# Patient Record
Sex: Male | Born: 1955 | Race: White | Hispanic: No | Marital: Married | Smoking: Light tobacco smoker
Health system: Southern US, Community
[De-identification: ages and names within clinical notes are randomized; demographics above are authoritative.]

## PROBLEM LIST (undated history)

## (undated) DIAGNOSIS — R002 Palpitations: Secondary | ICD-10-CM

## (undated) DIAGNOSIS — I493 Ventricular premature depolarization: Secondary | ICD-10-CM

## (undated) HISTORY — DX: Palpitations: R00.2

## (undated) HISTORY — DX: Ventricular premature depolarization: I49.3

## (undated) HISTORY — PX: OTHER SURGICAL HISTORY: SHX169

---

## 2015-01-25 ENCOUNTER — Telehealth: Payer: Self-pay | Admitting: Cardiovascular Disease

## 2015-01-25 NOTE — Telephone Encounter (Signed)
I will forward this message to Dr Cooper.  

## 2015-01-25 NOTE — Telephone Encounter (Signed)
Dr Excell Seltzer spoke with Dr Roanna Banning and this pt was hospitalized in [place].  Per Dr Excell Seltzer the pt needs a New Pt appt.  The pt can be scheduled on 02/04/15 at 3:30 or 02/09/15 at 4:15 with Dr Excell Seltzer. I left the pt a message to contact our office to arrange appointment.  The pt will need to bring his hospital records to appointment or have them faxed to the office for Dr Excell Seltzer to review.

## 2015-01-25 NOTE — Telephone Encounter (Signed)
Dr Loleta Rose is requesting a call from Dr. Donella Stade he is out until 01-28-15

## 2015-01-27 NOTE — Telephone Encounter (Signed)
Pt scheduled to see Dr Excell Seltzer on 02/09/15.

## 2015-02-09 ENCOUNTER — Encounter: Payer: Self-pay | Admitting: Cardiovascular Disease

## 2015-02-09 ENCOUNTER — Ambulatory Visit (INDEPENDENT_AMBULATORY_CARE_PROVIDER_SITE_OTHER): Payer: Managed Care, Other (non HMO) | Admitting: Cardiovascular Disease

## 2015-02-09 VITALS — BP 112/70 | HR 66 | Ht 71.0 in | Wt 204.8 lb

## 2015-02-09 DIAGNOSIS — I493 Ventricular premature depolarization: Secondary | ICD-10-CM

## 2015-02-09 DIAGNOSIS — I429 Cardiomyopathy, unspecified: Secondary | ICD-10-CM

## 2015-02-09 MED ORDER — METOPROLOL SUCCINATE ER 25 MG PO TB24: ORAL_TABLET | ORAL | Status: DC

## 2015-02-09 NOTE — Patient Instructions (Addendum)
Medication Instructions: - Increase toprol (metoprolol succinate) to 25 mg one whole tablet at bedtime  Labwork: - Your physician recommends that you return for FASTING lab work next week/ the day your holter monitor is placed: lipid/ liver  Procedures/Testing: - Your physician has recommended that you wear a 24 hour holter monitor- next week. Holter monitors are medical devices that record the heart's electrical activity. Doctors most often use these monitors to diagnose arrhythmias. Arrhythmias are problems with the speed or rhythm of the heartbeat. The monitor is a small, portable device. You can wear one while you do your normal daily activities. This is usually used to diagnose what is causing palpitations/syncope (passing out).  Your physician has requested that you have a cardiac MRI- in 6 months. Cardiac MRI uses a computer to create images of your heart as its beating, producing both still and moving pictures of your heart and major blood vessels. For further information please visit [email or web address]. Please follow the instruction sheet given to you today for more information.  Follow-Up: Your physician wants you to follow-up in: 6 months with Dr. Excell Seltzer (just after your cardiac MRI) You will receive a reminder letter in the mail two months in advance. If you don't receive a letter, please call our office to schedule the follow-up appointment.  Any Additional Special Instructions Will Be Listed Below (If Applicable).

## 2015-02-09 NOTE — Progress Notes (Signed)
Cardiology Office Note   Date:  02/09/2015   ID:  Derrick Wood, DOB 1955/10/10, MRN [id number]  PCP:  No primary care provider on file.  Cardiologist:  Tonny Bollman, MD    Chief Complaint  Patient presents with  . Palpitations    History of Present Illness: Derrick Wood is a 59 y.o. male who presents for evaluation of PVC's and cardiomyopathy. He has been healthy and really has not had any past medical problems.   He was recently traveling in [place], [place] and complained of palpitations and associated nausea and weakness. This occurred in the context of increased alcohol intake, energy drinks, and mild dehydration. He has not had chest pain or pressure, dyspnea, lightheadedness, or syncope. He exercises vigorously and has no exertional symptoms. When his symptoms occurred, he was hospitalized for observation and underwent an echo and stress test. He was noted to have the following findings:  Mild LV dysfunction by echo with an LVEF of 45%  Bicuspid aortic valve with normal function  Mildly dilated aortic root  Good exercise tolerance with diaphragmatic attenuation but otherwise normal perfusion on stress testing   Past Medical History  Diagnosis Date  . Palpitations   . PVC (premature ventricular contraction)     Past Surgical History  Procedure Laterality Date  . Rotater cuff repair      LEFT SHOULDER    Current Outpatient Prescriptions  Medication Sig Dispense Refill  . famotidine (PEPCID) 20 MG tablet Take 20 mg by mouth 2 (two) times daily.    . metoprolol succinate (TOPROL-XL) 25 MG 24 hr tablet Take 25 mg by mouth daily.    Marland Kitchen zolpidem (AMBIEN) 5 MG tablet Take 5 mg by mouth at bedtime as needed for sleep (As need for sleep).     No current facility-administered medications for this visit.    Allergies:   Codeine   Social History:  The patient  reports that he has been smoking.  He started smoking about 10 years ago. He does not have any smokeless  tobacco history on file. He reports that he drinks alcohol. He reports that he does not use illicit drugs.   Family History:  The patient's family history includes Heart attack in his father; Heart failure in his father; Hypertension in his brother.   Father massive MI at 30, died of CHF at 46 Heart disease in paternal uncles Mother without heart problems   ROS:  Please see the history of present illness.  Otherwise, review of systems is positive for palpitations.  All other systems are reviewed and negative.    PHYSICAL EXAM: VS:  BP 112/70 mmHg  Pulse 66  Ht  (1.803 m)  Wt 204 lb 12.8 oz (92.897 kg)  BMI 28.58 kg/m2 , BMI Body mass index is 28.58 kg/(m^2). GEN: Well nourished, well developed, in no acute distress HEENT: normal Neck: no JVD, no masses. No carotid bruits Cardiac: RRR without murmur or gallop                Respiratory:  clear to auscultation bilaterally, normal work of breathing GI: soft, nontender, nondistended, + BS MS: no deformity or atrophy Ext: no pretibial edema, pedal pulses 2+= bilaterally Skin: warm and dry, no rash Neuro:  Strength and sensation are intact Psych: euthymic mood, full affect  EKG:  EKG is ordered today. The ekg ordered today shows NSR 66 bpm, within normal limits  Recent Labs: No results found for requested labs within last 365 days.  Lipid Panel  No results found for: CHOL, TRIG, HDL, CHOLHDL, VLDL, LDLCALC, LDLDIRECT    Wt Readings from Last 3 Encounters:  02/09/15 204 lb 12.8 oz (92.897 kg)     Cardiac Studies Reviewed: Echo, nuclear study reviewed via Care Everywhere  2D Echo: Findings   Study Quality:  The study quality is technically difficult.    Left Ventricle:  The left ventricular chamber size is normal. The estimated ejection  fraction is 40-45%. Abnormal left ventricular diastolic filling is  observed, consistent with impaired relaxation.    Left Atrium:  The left atrial chamber  size is normal.     Right Ventricle:  The right ventricle is not well visualized.     Right Atrium:  The right atrium is not well visualized.     Aortic Valve:  The aortic valve appears bicuspid. The aortic valve leaflets are mildly  thickened. There is no evidence of aortic stenosis. There is no evidence  of aortic regurgitation.     Mitral Valve:  The mitral valve leaflets appear normal. There is a trace of mitral  regurgitation.     Pericardium:  There is no pericardial effusion.     Aorta:  There is mild dilatation of the aortic root.     Conclusions  The study quality is technically difficult.   The left ventricular chamber size is normal.  The estimated ejection fraction is 40-45%.  Abnormal left ventricular diastolic filling is observed, consistent with  impaired relaxation.   There is mild dilatation of the aortic root.  The aortic valve appears bicuspid  Stress Myoview: Clinical Findings:   Exercise METS: 10.1   Baseline: HR=82 BPMBP=109/83 mm HG   Max: HR=137 BPM;Target HR was 137 BP= 193/65 mm Hg   Arrhythmia: Frequent PVCs which decreased in the last stage, one couplet   Symptoms: None   Resting EKG: Normal sinus rhythm with PVCs   Stress EKG: No ST depression   Test Termination: Target heart rate reached, hypertension    Stress Summary:   1. Adequate treadmill exercise test  2. Hypertensive response to exercise  3. Good exercise tolerance  4. Negative for ischemia  5. Negative for angina    Nuclear Perfusion Findings:   Raw data chest, good quality study prone to attenuation artifact.  Static image review: Inferior apical and basilar inferior hypoperfusion. No wall motion abnormality. The inferior defect is most likely attenuation related. Ejection fraction 49%.    ASSESSMENT AND PLAN: 1.  Palpitations/PVC's: suspect related to  dehydration/alcohol/energy drinks. Counseled regarding importance of limiting Etoh to 2 drinks or less, staying well-hydrated, and avoiding energy drinks or excessive caffeine. Issue of mild cardiomyopathy brings up question of whether he has more significant chronic PVC burden. Recommend a 24 Holter to quantify.  2. Cardiomyopathy: NYHA Functional Class 1 symptoms with the ability to exercise for > 1 hour without symptoms. Recommended increase Toprol XL to 25 mg daily. Will check Cardiac MRI after 6 months of beta-blockade to reassess. Doubt BP will allow for additional Rx and would only pursue ACE/ARB if LVEF remains down after 6 month reassessment.   3. Bicuspid Ao Valve: normal function by echo and physical exam. Cardiac MRI in 6 months to assess for significant aortic dilatation.   Current medicines are reviewed with the patient today.  The patient does not have concerns regarding medicines.  Labs/ tests ordered today include:   Orders Placed This Encounter  Procedures  . MR Card Morphology Wo/W Cm  .  Lipid panel  . Hepatic function panel  . Holter monitor - 24 hour  . EKG 12-Lead   Disposition:   FU 6 months after Cardiac MRI  Signed, Tonny Bollman, MD  02/09/2015 5:34 PM    [facility] [address] [place], [place], [place]  [address] Phone: [phone]; Fax: [phone]

## 2015-02-10 ENCOUNTER — Ambulatory Visit: Payer: Self-pay | Admitting: Cardiology

## 2015-02-15 ENCOUNTER — Encounter: Payer: Self-pay | Admitting: Cardiovascular Disease

## 2015-02-18 ENCOUNTER — Other Ambulatory Visit: Payer: Managed Care, Other (non HMO)

## 2015-02-24 ENCOUNTER — Other Ambulatory Visit (INDEPENDENT_AMBULATORY_CARE_PROVIDER_SITE_OTHER): Payer: Managed Care, Other (non HMO) | Admitting: *Deleted

## 2015-02-24 ENCOUNTER — Ambulatory Visit (INDEPENDENT_AMBULATORY_CARE_PROVIDER_SITE_OTHER): Payer: Managed Care, Other (non HMO)

## 2015-02-24 DIAGNOSIS — I493 Ventricular premature depolarization: Secondary | ICD-10-CM | POA: Diagnosis not present

## 2015-02-24 DIAGNOSIS — I429 Cardiomyopathy, unspecified: Secondary | ICD-10-CM

## 2015-02-24 LAB — LIPID PANEL
Cholesterol: 213 mg/dL — ABNORMAL HIGH (ref 0–200)
HDL: 56.9 mg/dL (ref >39.00)
LDL Cholesterol: 129 mg/dL — ABNORMAL HIGH (ref 0–99)
NonHDL: 156.1
TRIGLYCERIDES: 137 mg/dL (ref 0.0–149.0)
Total CHOL/HDL Ratio: 4
VLDL: 27.4 mg/dL (ref 0.0–40.0)

## 2015-02-24 LAB — HEPATIC FUNCTION PANEL
ALBUMIN: 4 g/dL (ref 3.5–5.2)
ALT: 20 U/L (ref 0–53)
AST: 22 U/L (ref 0–37)
Alkaline Phosphatase: 48 U/L (ref 39–117)
Bilirubin, Direct: 0.2 mg/dL (ref 0.0–0.3)
TOTAL PROTEIN: 6.8 g/dL (ref 6.0–8.3)
Total Bilirubin: 0.8 mg/dL (ref 0.2–1.2)

## 2015-07-13 ENCOUNTER — Ambulatory Visit (HOSPITAL_COMMUNITY)
Admission: RE | Admit: 2015-07-13 | Discharge: 2015-07-13 | Disposition: A | Discharge status: Home / Self Care | Payer: Managed Care, Other (non HMO) | Source: Ambulatory Visit | Attending: Cardiovascular Disease | Admitting: Cardiovascular Disease

## 2015-07-13 DIAGNOSIS — I429 Cardiomyopathy, unspecified: Secondary | ICD-10-CM | POA: Diagnosis not present

## 2015-07-13 DIAGNOSIS — I493 Ventricular premature depolarization: Secondary | ICD-10-CM | POA: Insufficient documentation

## 2015-07-13 DIAGNOSIS — Q231 Congenital insufficiency of aortic valve: Secondary | ICD-10-CM | POA: Diagnosis not present

## 2015-07-13 DIAGNOSIS — I77810 Thoracic aortic ectasia: Secondary | ICD-10-CM | POA: Diagnosis not present

## 2015-07-13 LAB — CREATININE, SERUM
Creatinine, Ser: 1.16 mg/dL (ref 0.61–1.24)
GFR calc non Af Amer: >60 mL/min (ref >60)

## 2015-07-13 MED ORDER — GADOBENATE DIMEGLUMINE 529 MG/ML IV SOLN
30.0000 mL | Freq: Once | INTRAVENOUS | Status: AC
  Administered 2015-07-13: 29 mL via INTRAVENOUS

## 2015-07-28 ENCOUNTER — Telehealth: Payer: Self-pay | Admitting: Cardiovascular Disease

## 2015-07-28 MED ORDER — RAMIPRIL 2.5 MG PO CAPS: 2.5000 mg | ORAL_CAPSULE | Freq: Every day | ORAL | Status: DC

## 2015-07-28 NOTE — Telephone Encounter (Signed)
Notes Recorded by Tonny Bollman, MD on 07/21/2015 at 6:37 AM MRI reviewed. Mild LV/RV dysfunction. Would start ramipril 2.5 mg daily to add to his beta-blocker. Should have echo (LV dysfunction) and MRA chest (Aortic root dilatation with bicuspid Ao valve) in ONE YEAR   Pt aware of results by phone. Rx sent to the pharmacy.  Pt scheduled for routine follow-up with Dr Excell Seltzer in December.

## 2015-07-28 NOTE — Telephone Encounter (Signed)
Follow Up  ° °Pt returned call for results °

## 2015-08-16 DIAGNOSIS — R002 Palpitations: Secondary | ICD-10-CM | POA: Insufficient documentation

## 2015-08-16 DIAGNOSIS — I493 Ventricular premature depolarization: Secondary | ICD-10-CM | POA: Insufficient documentation

## 2015-08-18 ENCOUNTER — Ambulatory Visit (INDEPENDENT_AMBULATORY_CARE_PROVIDER_SITE_OTHER): Payer: Managed Care, Other (non HMO) | Admitting: Cardiovascular Disease

## 2015-08-18 VITALS — BP 108/80 | HR 67 | Ht 71.0 in | Wt 207.5 lb

## 2015-08-18 DIAGNOSIS — I428 Other cardiomyopathies: Secondary | ICD-10-CM

## 2015-08-18 DIAGNOSIS — I77810 Thoracic aortic ectasia: Secondary | ICD-10-CM

## 2015-08-18 DIAGNOSIS — I429 Cardiomyopathy, unspecified: Secondary | ICD-10-CM

## 2015-08-18 DIAGNOSIS — Q231 Congenital insufficiency of aortic valve: Secondary | ICD-10-CM

## 2015-08-18 MED ORDER — RAMIPRIL 5 MG PO CAPS: 5.0000 mg | ORAL_CAPSULE | Freq: Every day | ORAL | Status: DC

## 2015-08-18 NOTE — Patient Instructions (Addendum)
Medication Instructions:  Your physician has recommended you make the following change in your medication:  1. INCREASE Ramipril to 5mg  take one tablet by mouth daily  Labwork: Your physician recommends that you return for a FASTING LIPID and CMP in 1 YEAR--nothing to eat or drink after midnight, lab opens at 7:30 AM  Testing/Procedures: Your physician has requested that you have an echocardiogram in 1 YEAR.  Echocardiography is a painless test that uses sound waves to create images of your heart. It provides your doctor with information about the size and shape of your heart and how well your heart's chambers and valves are working. This procedure takes approximately one hour. There are no restrictions for this procedure.  Your physician has requested that you have a cardiac MRI in 1 YEAR. Cardiac MRI uses a computer to create images of your heart as its beating, producing both still and moving pictures of your heart and major blood vessels. For further information please visit [email or web address]. Please follow the instruction sheet given to you today for more information.  Follow-Up: Your physician wants you to follow-up in: 1 YEAR with Dr Excell Seltzer.  You will receive a reminder letter in the mail two months in advance. If you don't receive a letter, please call our office to schedule the follow-up appointment.   Any Other Special Instructions Will Be Listed Below (If Applicable).     If you need a refill on your cardiac medications before your next appointment, please call your pharmacy.

## 2015-08-19 ENCOUNTER — Encounter: Payer: Self-pay | Admitting: Cardiovascular Disease

## 2015-08-19 DIAGNOSIS — I428 Other cardiomyopathies: Secondary | ICD-10-CM | POA: Insufficient documentation

## 2015-08-19 NOTE — Progress Notes (Signed)
Cardiology Office Note Date:  08/19/2015   ID:  Derrick Wood, DOB [birth year], MRN [id number]  PCP:  Sherren Mocha, MD  Cardiologist:  Sherren Mocha, MD    Chief Complaint  Patient presents with  . Cardiomyopathy     History of Present Illness: Derrick Wood is a 59 y.o. male who presents for follow-up of PVC's and cardiomyopathy. He was initially seen in June 2016 after presenting at a hospital in [place] with palpitations and weakness. Echo and nuclear stress testing was performed and showed:  Mild LV dysfunction by echo with an LVEF of 45%  Bicuspid aortic valve with normal function  Mildly dilated aortic root  Good exercise tolerance with diaphragmatic attenuation but otherwise normal perfusion on stress testing  The patient feels well. He just returned from a trip to [place]. Today, he denies symptoms of palpitations, chest pain, shortness of breath, orthopnea, PND, lower extremity edema, dizziness, or syncope. He continues to exercise regularly.     Past Medical History  Diagnosis Date  . Palpitations   . PVC (premature ventricular contraction)     Past Surgical History  Procedure Laterality Date  . Rotater cuff repair      LEFT SHOULDER    Current Outpatient Prescriptions  Medication Sig Dispense Refill  . aspirin 81 MG tablet Take 81 mg by mouth daily.    . metoprolol succinate (TOPROL-XL) 25 MG 24 hr tablet Take one tablet (25 mg) by mouth once daily at bedtime 30 tablet 11  . omeprazole (PRILOSEC) 20 MG capsule Take 20 mg by mouth daily.    . ramipril (ALTACE) 5 MG capsule Take 1 capsule (5 mg total) by mouth daily. 90 capsule 3  . zolpidem (AMBIEN) 5 MG tablet Take 5 mg by mouth at bedtime as needed for sleep (As need for sleep).     No current facility-administered medications for this visit.    Allergies:   Codeine   Social History:  The patient  reports that he has been smoking.  He started smoking about 10 years ago. He does not have any  smokeless tobacco history on file. He reports that he drinks alcohol. He reports that he does not use illicit drugs.   Family History:  The patient's  family history includes Heart attack in his father; Heart failure in his father; Hypertension in his brother.    ROS:  Please see the history of present illness.  All other systems are reviewed and negative.    PHYSICAL EXAM: VS:  BP 108/80 mmHg  Pulse 67  Ht '5\' 11"'  (1.803 m)  Wt 207 lb 8 oz (94.121 kg)  BMI 28.95 kg/m2 , BMI Body mass index is 28.95 kg/(m^2). GEN: Well nourished, well developed, in no acute distress HEENT: normal Neck: no JVD, no masses. No carotid bruits Cardiac: RRR without murmur or gallop                Respiratory:  clear to auscultation bilaterally, normal work of breathing GI: soft, nontender, nondistended, + BS MS: no deformity or atrophy Ext: no pretibial edema, pedal pulses 2+= bilaterally Skin: warm and dry, no rash Neuro:  Strength and sensation are intact Psych: euthymic mood, full affect  EKG:  EKG is ordered today. The ekg ordered today shows NSR 67 bpm, within normal limits  Recent Labs: 02/24/2015: ALT 20 07/13/2015: Creatinine, Ser 1.16   Lipid Panel     Component Value Date/Time   CHOL 213* 02/24/2015 0809   TRIG 137.0 02/24/2015  0809   HDL 56.90 02/24/2015 0809   CHOLHDL 4 02/24/2015 0809   VLDL 27.4 02/24/2015 0809   LDLCALC 129* 02/24/2015 0809      Wt Readings from Last 3 Encounters:  08/18/15 207 lb 8 oz (94.121 kg)  02/09/15 204 lb 12.8 oz (92.897 kg)     Cardiac Studies Reviewed: Cardiac MRI: FINDINGS: 1. Normal left ventricular size, thickness and mildly decreased systolic function (LVEF = 47%) with diffuse hypokinesis.  LVEDD: 45 mm  LVESD: 38 mm  LVEDV: 128 ml  LVESV: 68 ml  SV: 60 ml  CO: 3.9 L/minute  2. Normal right ventricular size, thickness and borderline systolic function (RVEF = 44%, normal RVEF > 45%) with no regional wall motion  abnormalities.  RVEDV: 131 ml  RVESV: 73 ml  SV: 58 ml  CO: 3.8 L/minute  3. Normal biatrial size.  4. Bicuspid aortic valve with mild aortic regurgitation. Aortic root is mildly dilated measuring 40 mm, ascending aorta is mildly dilated measuring 42 mm.  5. Mild tricuspid and trivial mitral regurgitation.  6. Minimal focal late gadolinium enhancement at the RV attachment to the left ventricle.  7. Normal size of the pulmonary artery.  IMPRESSION: 1. Normal left ventricular size, thickness and mildly decreased systolic function (LVEF = 47%) with diffuse hypokinesis.  2. Normal right ventricular size, thickness and borderline systolic function (RVEF = 44%, normal RVEF > 45%) with no regional wall motion abnormalities.  3. Minimal focal late gadolinium enhancement at the RV attachment to the left ventricle consistent with elevated filling pressures.  4. Bicuspid aortic valve with mild aortic regurgitation. Aortic root is mildly dilated measuring 40 mm, ascending aorta is mildly dilated measuring 42 mm. No prior study available in Epic for comparison.  Stress Myoview: Clinical Findings:   Exercise METS: 10.1   Baseline: HR=82 BPMBP=109/83 mm HG   Max: HR=137 BPM;Target HR was 137 BP= 193/65 mm Hg   Arrhythmia: Frequent PVCs which decreased in the last stage, one couplet   Symptoms: None   Resting EKG: Normal sinus rhythm with PVCs   Stress EKG: No ST depression   Test Termination: Target heart rate reached, hypertension    Stress Summary:   1. Adequate treadmill exercise test  2. Hypertensive response to exercise  3. Good exercise tolerance  4. Negative for ischemia  5. Negative for angina    Nuclear Perfusion Findings:   Raw data chest, good quality study prone to attenuation artifact.  Static image review: Inferior apical and basilar inferior  hypoperfusion. No wall motion abnormality. The inferior defect is most likely attenuation related. Ejection fraction 49%.    2D Echo: Findings   Study Quality:  The study quality is technically difficult.    Left Ventricle:  The left ventricular chamber size is normal. The estimated ejection  fraction is 40-45%. Abnormal left ventricular diastolic filling is  observed, consistent with impaired relaxation.    Left Atrium:  The left atrial chamber size is normal.     Right Ventricle:  The right ventricle is not well visualized.     Right Atrium:  The right atrium is not well visualized.     Aortic Valve:  The aortic valve appears bicuspid. The aortic valve leaflets are mildly  thickened. There is no evidence of aortic stenosis. There is no evidence  of aortic regurgitation.     Mitral Valve:  The mitral valve leaflets appear normal. There is a trace of mitral  regurgitation.     Pericardium:  There is no pericardial effusion.     Aorta:  There is mild dilatation of the aortic root.     Conclusions  The study quality is technically difficult.   The left ventricular chamber size is normal.  The estimated ejection fraction is 40-45%.  Abnormal left ventricular diastolic filling is observed, consistent with  impaired relaxation.   There is mild dilatation of the aortic root.  The aortic valve appears bicuspid  ASSESSMENT AND PLAN: 1.  Nonischemic cardiomyopathy: NYHA I, LVEF 45% - discussed need to reduce alcohol consumption. Otherwise pt is following a prudent diet and exercising regularly. Will increase ramipril to 5 mg daily. Continue carvedilol. Repeat Cardiac MRI in one year. See back in one year for follow-up.  2. Palpitations/PVC's: low PVC burden on Holter monitor. Continue beta-blocker.  3. Bicuspid aortic valve: no significant valvular dysfunction by echo or exam. MRI noted  mild AI. Will check echo next year prior to his return visit.   4. Aortic dilatation: needs imaging follow-up in setting bicuspid aortic valve. MRI in one year.  Current medicines are reviewed with the patient today.  The patient does not have concerns regarding medicines.  Labs/ tests ordered today include:  Orders Placed This Encounter  Procedures  . MR Card Morphology Wo/W Cm  . Lipid panel  . Comp Met (CMET)  . EKG 12-Lead  . Echocardiogram    Disposition:   FU one year  Signed, Sherren Mocha, MD  08/19/2015 10:04 PM    [address], [place], [place]  [address] Phone: [phone]; Fax: [phone]

## 2015-10-04 ENCOUNTER — Telehealth: Payer: Self-pay | Admitting: Cardiovascular Disease

## 2015-10-04 NOTE — Telephone Encounter (Signed)
I spoke with the patient. He woke up at 4 am with "severe" dull ache to his left arm, relieved after about 20 min and Aleve.  Back to sleep, then to gym this am for 20-30 min of cardio, not his usual intensity.  No arm or chest pain at that time. Since then, this am, he has had intermittent chest "discomfort", not currently.  He is at work, HR is 66-68.  No other associated symptoms. Scheduled with [employer] for tomorrow. Advised patient to spend rest of today taking it easy, relax and if symptoms return and do not go away, feel worse, or are accompanied by other symptoms such as sweating, SOB, nausea, he is to call EMS and go to hospital.  Pt verbalizes understanding and agreement and appreciation for assistance.

## 2015-10-04 NOTE — Telephone Encounter (Signed)
New message      Pt c/o of Chest Pain: STAT if CP now or developed within 24 hours  1. Are you having CP right now? Slight chest pain now  2. Are you experiencing any other symptoms (ex. SOB, nausea, vomiting, sweating)? No except 4am pt had extreme pain in his left arm relieved by advil and aleve 3. How long have you been experiencing CP?  Within the last hour  4. Is your CP continuous or coming and going? Comes and goes 5. Have you taken Nitroglycerin? no?

## 2015-10-05 ENCOUNTER — Ambulatory Visit (INDEPENDENT_AMBULATORY_CARE_PROVIDER_SITE_OTHER): Payer: 59 | Admitting: Cardiology

## 2015-10-05 ENCOUNTER — Encounter: Payer: Self-pay | Admitting: Cardiology

## 2015-10-05 VITALS — BP 113/75 | HR 55 | Ht 71.0 in | Wt 204.6 lb

## 2015-10-05 DIAGNOSIS — M79602 Pain in left arm: Secondary | ICD-10-CM | POA: Diagnosis not present

## 2015-10-05 LAB — TROPONIN I

## 2015-10-05 NOTE — Patient Instructions (Signed)
Medication Instructions:  NO CHANGES  Labwork: TODAY STAT TROPONIN  Testing/Procedures: NONE  Follow-Up: KEEP FOLLOW UP  AS  PLANNED  Any Other Special Instructions Will Be Listed Below (If Applicable).     If you need a refill on your cardiac medications before your next appointment, please call your pharmacy.

## 2015-10-05 NOTE — Progress Notes (Signed)
10/05/2015 Derrick Wood   04/16/1956  [id number]  Primary Physician Tonny Bollman, MD Primary Cardiologist: Dr. Excell Seltzer   Reason for Visit/CC: Left Arm Pain  HPI:  60 y/o male, followed by Dr. Excell Seltzer, with a h/o treated HTN and tobacco abuse (smokes 1 pack/ week) + regular ETOH use. He exercises regularly at the gym, several days a week w/o limitation. He first established care with Dr. Excell Seltzer in June 2016 after  presenting at a hospital in [place] with palpitations and weakness. Echo and nuclear stress testing was performed and showed:  Mild LV dysfunction by echo with an LVEF of 45%  Bicuspid aortic valve with normal function  Mildly dilated aortic root Good exercise tolerance with diaphragmatic attenuation but otherwise normal perfusion on stress testing  Dr. Excell Seltzer continued w/u with a cardiac MRI. This showed normal LV size and thickness with mildly decreased systolic function (EF 47%) with diffuse hypokinesis. Normal right ventricular size, and borderline systolic function (RVEF = 44%, normal RVEF > 45%) with no regional wall motion abnormalities.  Also with bicuspid aortic valve with mild aortic regurgitation. Aortic root is mildly dilated measuring 40 mm, ascending aorta is mildly dilated measuring 42 mm. He was also monitored with a 24 hr Holter monitor which showed low PVC burden. He was continued on a BB for PVCs and reduced ETOH consumption was recommended. Dr. Excell Seltzer recommended repeat cardiac MRI in 1 year.   He presents to clinic today with a complaint of left arm pain that occurred around yesterday morning around 4 AM. He noted dull achy discomfort in his left arm. He had no associated chest, neck or back pain. No associated dyspnea, diaphoresis, n/v, dizziness, syncope/ near syncope. The pain lasted nearly 1 hr. He took Ibuprofen and the discomfort resolved. Later that morning he had mild left sided chest achy discomfort that lasted ~5 minutes, with no exacerbating  factors. It resolves spontaneously. He denies any recurrent CP or left arm pain since yesterday morning. In fact, later that same day, he went to the gym and performed his regular exercise routine, which he notes is moderate activity for roughly 45 minutes, on the treadmill. He also lifted weights. He had no exertional chest pain or dyspnea.   His EKG today shows sinus brady with a rate of 55 bpm. No ischemic abnormalities. BP is well controlled at 55 bpm.     Current Outpatient Prescriptions  Medication Sig Dispense Refill  . aspirin 81 MG tablet Take 81 mg by mouth daily.    . metoprolol succinate (TOPROL-XL) 25 MG 24 hr tablet Take one tablet (25 mg) by mouth once daily at bedtime 30 tablet 11  . omeprazole (PRILOSEC) 20 MG capsule Take 20 mg by mouth daily.    . ramipril (ALTACE) 5 MG capsule Take 1 capsule (5 mg total) by mouth daily. 90 capsule 3  . zolpidem (AMBIEN) 5 MG tablet Take 5 mg by mouth at bedtime as needed for sleep (As need for sleep).     No current facility-administered medications for this visit.    Allergies  Allergen Reactions  . Codeine Nausea And Vomiting    Social History   Social History  . Marital Status: Married    Spouse Name: N/A  . Number of Children: N/A  . Years of Education: N/A   Occupational History  . Not on file.   Social History Main Topics  . Smoking status: Light Tobacco Smoker    Start date: 02/08/2005  . Smokeless tobacco:  Not on file  . Alcohol Use: Yes  . Drug Use: No  . Sexual Activity: Not on file   Other Topics Concern  . Not on file   Social History Narrative     Review of Systems: General: negative for chills, fever, night sweats or weight changes.  Cardiovascular: negative for chest pain, dyspnea on exertion, edema, orthopnea, palpitations, paroxysmal nocturnal dyspnea or shortness of breath Dermatological: negative for rash Respiratory: negative for cough or wheezing Urologic: negative for hematuria Abdominal:  negative for nausea, vomiting, diarrhea, bright red blood per rectum, melena, or hematemesis Neurologic: negative for visual changes, syncope, or dizziness All other systems reviewed and are otherwise negative except as noted above.    Blood pressure 113/75, pulse 55, height 5\' 11"  (1.803 m), weight 204 lb 9.6 oz (92.806 kg).  General appearance: alert, cooperative and no distress Neck: no carotid bruit and no JVD Lungs: clear to auscultation bilaterally Heart: regular rate and rhythm, S1, S2 normal, no murmur, click, rub or gallop Extremities: no LEE Pulses: 2+ and symmetric Skin: warm and dry Neurologic: Grossly normal  EKG sinus bradycardia. HR 55 bpm.   ASSESSMENT AND PLAN:   1. Atypical Chest and Left Arm Pain: patient with recent normal stress 02/2015 that was negative for ischemia. His recent symptoms are a bit atypical and relieved with NSAIDS. Following his episode yesterday morning, he worked out at [employer] performing moderate physical activity with cardio on the treadmill + weight lifting w/o any recurrent exertional symptoms. His EKG is reassuring w/o ischemia. His initial episode was <30 hrs ago. We will check a troponin. If negative, would not pursue any additional ischemic w/u. His HR and BP are both well controlled and he is on appropriate medical therapy for primary prevention of CAD (ASA, BB and ACE-I). Patient advised to quit smoking and to call our office back if any recurrent symptoms, especially if any exertional CP of dyspnea. He can otherwise plan to keep his f/u with Dr. Excell Seltzer 08/2016.   2. PVCs: he denies any recurrence with metoprolol. He is well beta blocked. HR is 55 bpm. No PVCs on EKG.   3. HTN: BP is well controlled on BB and ACE-I.  4. 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 Stress test 02/2015 negative for ischemia. He is evuolemic on physical exam. No dyspnea, orthopnea, PND or LEE. Continue BB and ACE-I.   5. Tobacco Abuse: smoking cessation strongly  advised.   6. ETHO Abuse: patient notes daily use and admits that he needs to cut back. Patient encouraged to do so.    Cj Beecher PA-C 10/05/2015 11:10 AM

## 2015-10-19 ENCOUNTER — Other Ambulatory Visit: Payer: Self-pay | Admitting: Cardiovascular Disease

## 2015-12-12 ENCOUNTER — Other Ambulatory Visit: Payer: Self-pay | Admitting: Cardiovascular Disease

## 2015-12-13 ENCOUNTER — Other Ambulatory Visit: Payer: Self-pay | Admitting: Cardiovascular Disease

## 2016-02-14 ENCOUNTER — Other Ambulatory Visit: Payer: Self-pay | Admitting: Cardiovascular Disease

## 2016-06-21 IMAGING — MR MR CARD MORPHOLOGY WO/W CM
9 of 10 series · 15 of 16 positions shown · IV contrast (multihance)
Comparison: none

CLINICAL DATA: 58-year-old male with frequent PVCs, non-ischemic
cardiomyopathy (LVEF on echocardiogram 40-45%) and bicuspid aortic
valve with dilated aortic root.

EXAM:
CARDIAC MRI
TECHNIQUE: The patient was scanned on a 1.5 Tesla GE magnet. A dedicated
cardiac coil was used. Functional imaging was done using Fiesta
sequences. [DATE], and 4 chamber views were done to assess for RWMA's.
Modified Nila rule using a short axis stack was used to
calculate an ejection fraction on a dedicated work station using
Circle software. The patient received 30 cc of Multihance. After 10
minutes inversion recovery sequences were used to assess for
infiltration and scar tissue.
CONTRAST:  30 cc  of Multihance

[Series 3: bSSFP · sagittal · 8.0mm · 1.25mm/px · 1 of 14 slices shown (1 of 5)]
[im 1/14]
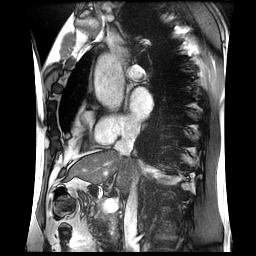

[Series 4: bSSFP · axial · 8.0mm · 1.37mm/px · 1 of 20 slices shown (2 of 5)]
[im 1/20]
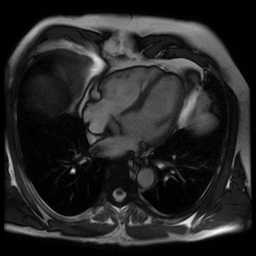

[Series 5: bSSFP · oblique · 8.0mm · 1.37mm/px · 3 of 360 slices shown (3 of 5)]
[im 1/360]
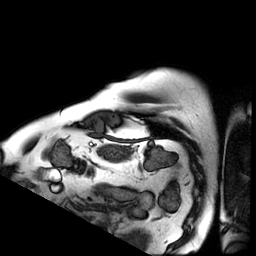
[im 180/360]
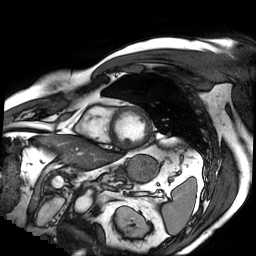
[im 360/360]
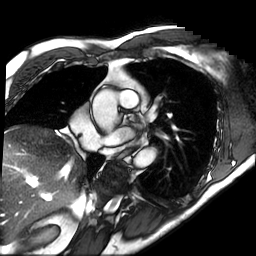

[Series 6: bSSFP · axial · 6.0mm · 0.55mm/px · z∈[-70,+44]mm · 5 of 400 slices shown (4 of 5)]
[im 1/400]
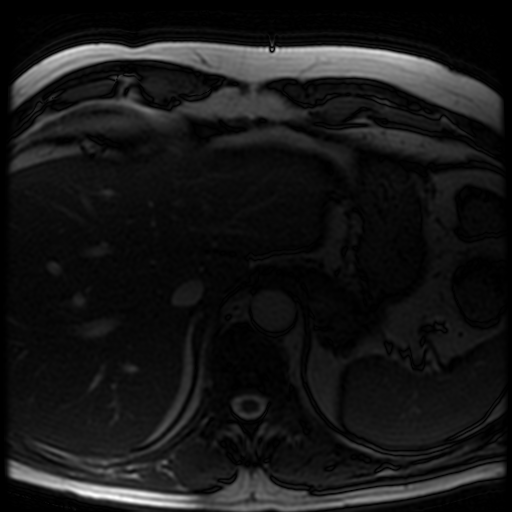
[im 100/400]
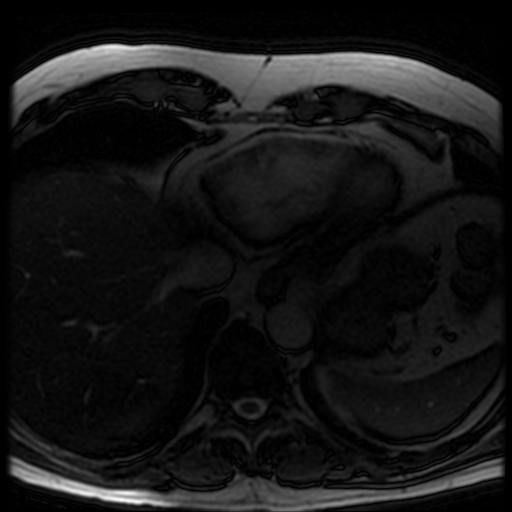
[im 200/400]
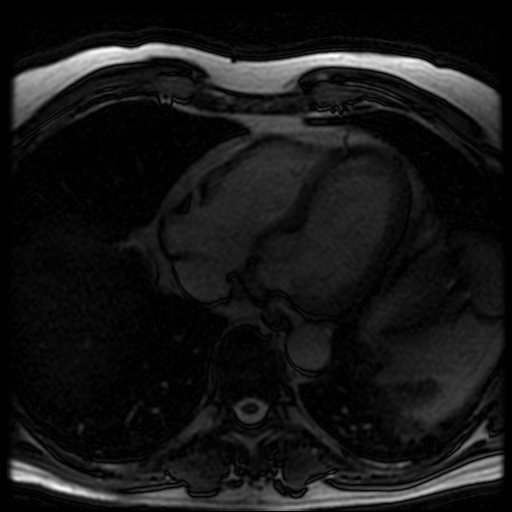
[im 300/400]
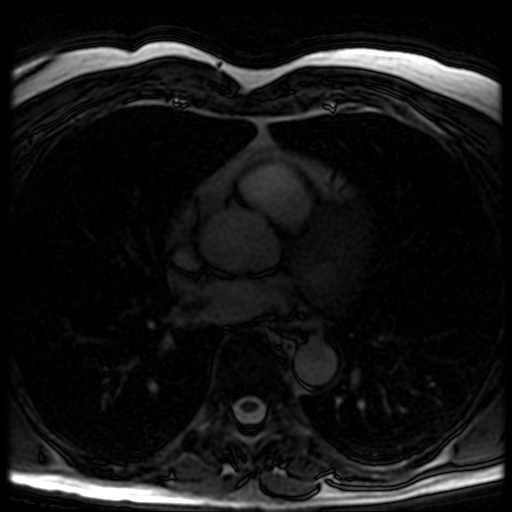
[im 400/400]
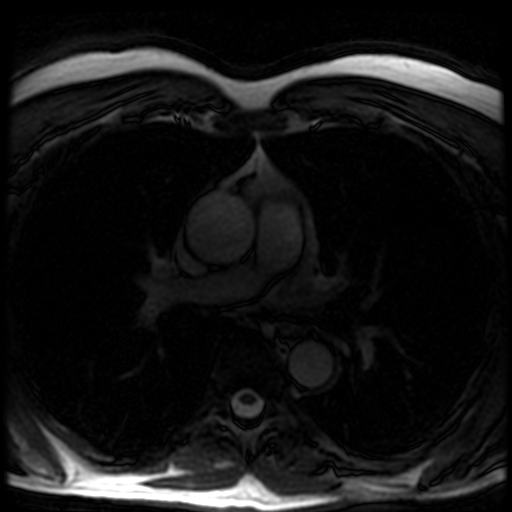

[Series 7: bSSFP · oblique · 8.0mm · 1.29mm/px · 1 of 60 slices shown (5 of 5)]
[im 1/60]
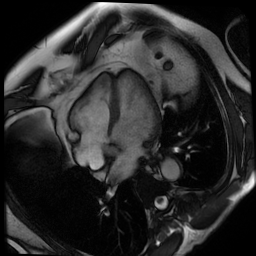

[Series 8: cine ir · oblique · 8.0mm · 1.48mm/px · 1 of 30 slices shown]
[im 1/30]
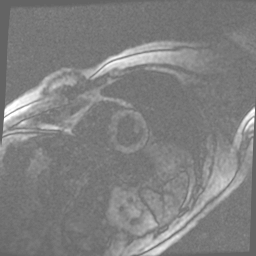

[Series 11: delayed ir prep · oblique · 8.0mm · 1.41mm/px · 1 of 4 slices shown (1 of 2)]
[im 1/4]
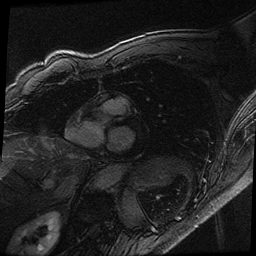

[Series 12: delayed ir prep · oblique · 8.0mm · 1.41mm/px · 1 of 12 slices shown (2 of 2)]
[im 1/12]
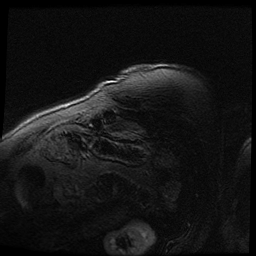

[Series 13: rad delayed ir · oblique · 8.0mm · 1.41mm/px · 1 of 3 slices shown]
[im 1/3]
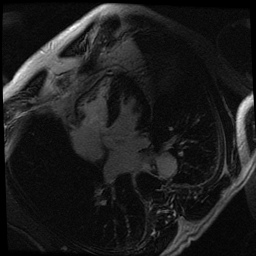

[15 of 16 positions shown; findings below may reference images not displayed]

FINDINGS: 1. Normal left ventricular size, thickness and mildly decreased
systolic function (LVEF = 47%) with diffuse hypokinesis.

LVEDD:  45 mm

LVESD:  38 mm

LVEDV:  128 ml

LVESV:  68 ml

SV:  60 ml

CO:  3.9 L/minute

2. Normal right ventricular size, thickness and borderline systolic
function (RVEF = 44%, normal RVEF > 45%) with no regional wall
motion abnormalities.

RVEDV:  131 ml

RVESV:  73 ml

SV:  58 ml

CO:  3.8 L/minute

3.  Normal biatrial size.

4. Bicuspid aortic valve with mild aortic regurgitation. Aortic root
is mildly dilated measuring 40 mm, ascending aorta is mildly dilated
measuring 42 mm.

5.  Mild tricuspid and trivial mitral regurgitation.

6. Minimal focal late gadolinium enhancement at the RV attachment to
the left ventricle.

7.  Normal size of the pulmonary artery.
IMPRESSION: 1. Normal left ventricular size, thickness and mildly decreased
systolic function (LVEF = 47%) with diffuse hypokinesis.

2. Normal right ventricular size, thickness and borderline systolic
function (RVEF = 44%, normal RVEF > 45%) with no regional wall
motion abnormalities.

3. Minimal focal late gadolinium enhancement at the RV attachment to
the left ventricle consistent with elevated filling pressures.

4. Bicuspid aortic valve with mild aortic regurgitation. Aortic root
is mildly dilated measuring 40 mm, ascending aorta is mildly dilated
measuring 42 mm. No prior study available in [REDACTED] for comparison.

Mmonie Poyo

## 2016-08-06 ENCOUNTER — Encounter: Payer: Self-pay | Admitting: Cardiovascular Disease

## 2016-08-07 ENCOUNTER — Encounter: Payer: Self-pay | Admitting: Cardiovascular Disease

## 2016-08-07 ENCOUNTER — Telehealth: Payer: Self-pay | Admitting: Cardiovascular Disease

## 2016-08-07 NOTE — Telephone Encounter (Signed)
Called patient and gave him date, time and location of cardiac MRI on 08-14-16 at 2:30 p.m.  Letter mailed today.

## 2016-08-13 ENCOUNTER — Ambulatory Visit (HOSPITAL_COMMUNITY): Payer: 59

## 2016-08-14 ENCOUNTER — Ambulatory Visit (HOSPITAL_COMMUNITY)
Admission: RE | Admit: 2016-08-14 | Discharge: 2016-08-14 | Disposition: A | Discharge status: Home / Self Care | Payer: 59 | Source: Ambulatory Visit | Attending: Cardiovascular Disease | Admitting: Cardiovascular Disease

## 2016-08-14 DIAGNOSIS — Q231 Congenital insufficiency of aortic valve: Secondary | ICD-10-CM | POA: Insufficient documentation

## 2016-08-14 DIAGNOSIS — I429 Cardiomyopathy, unspecified: Secondary | ICD-10-CM | POA: Diagnosis not present

## 2016-08-14 DIAGNOSIS — I77810 Thoracic aortic ectasia: Secondary | ICD-10-CM

## 2016-08-14 DIAGNOSIS — Q2381 Bicuspid aortic valve: Secondary | ICD-10-CM

## 2016-08-14 LAB — CREATININE, SERUM
Creatinine, Ser: 1.18 mg/dL (ref 0.61–1.24)
GFR calc Af Amer: >60 mL/min
GFR calc non Af Amer: >60 mL/min

## 2016-08-14 MED ORDER — GADOBENATE DIMEGLUMINE 529 MG/ML IV SOLN
30.0000 mL | Freq: Once | INTRAVENOUS | Status: AC | PRN
  Administered 2016-08-14: 30 mL via INTRAVENOUS

## 2016-08-29 ENCOUNTER — Telehealth: Payer: Self-pay | Admitting: Cardiovascular Disease

## 2016-08-29 NOTE — Telephone Encounter (Signed)
Left message on machine for pt to contact the office to discuss results of Cardiac MRI  Pt needs to be scheduled for Lipid and CMP and yearly office visit with Dr Excell Seltzer when he calls back.  Can offer the pt appointment with Dr Excell Seltzer on 09/04/16 at 12:00, 2:30 or 4:00.

## 2016-08-29 NOTE — Telephone Encounter (Signed)
New message   Patient returning call back to the nurse on test results

## 2016-08-30 NOTE — Telephone Encounter (Signed)
Follow up   Patient received call from Lauren Dr. Excell Seltzer nurse about cardiac MRI

## 2016-08-30 NOTE — Telephone Encounter (Signed)
Pt is aware of MRI results and MD's recommendations. Pt states he is going on vacation for the holidays and will return on January 2nd. He will be able to come for a yearly appointment and labs  in January 2018, except for the 16 th and 17 th.Dr. Excell Seltzer has no openings on January.

## 2016-09-17 NOTE — Telephone Encounter (Signed)
I spoke with the pt and appointment arranged on 10/01/16 with Dr Excell Seltzer. The pt will come in fasting and have lab work drawn at appointment.

## 2016-09-30 ENCOUNTER — Other Ambulatory Visit: Payer: Self-pay | Admitting: Cardiovascular Disease

## 2016-10-01 ENCOUNTER — Encounter: Payer: Self-pay | Admitting: Cardiovascular Disease

## 2016-10-01 ENCOUNTER — Encounter (INDEPENDENT_AMBULATORY_CARE_PROVIDER_SITE_OTHER): Payer: Self-pay

## 2016-10-01 ENCOUNTER — Ambulatory Visit (INDEPENDENT_AMBULATORY_CARE_PROVIDER_SITE_OTHER): Payer: 59 | Admitting: Cardiovascular Disease

## 2016-10-01 ENCOUNTER — Ambulatory Visit (INDEPENDENT_AMBULATORY_CARE_PROVIDER_SITE_OTHER)
Admission: RE | Admit: 2016-10-01 | Discharge: 2016-10-01 | Disposition: A | Discharge status: Home / Self Care | Payer: Self-pay | Source: Ambulatory Visit | Attending: Cardiovascular Disease | Admitting: Cardiovascular Disease

## 2016-10-01 VITALS — BP 118/78 | HR 80 | Ht 71.0 in | Wt 208.6 lb

## 2016-10-01 DIAGNOSIS — I428 Other cardiomyopathies: Secondary | ICD-10-CM

## 2016-10-01 DIAGNOSIS — Q231 Congenital insufficiency of aortic valve: Secondary | ICD-10-CM

## 2016-10-01 DIAGNOSIS — I77810 Thoracic aortic ectasia: Secondary | ICD-10-CM | POA: Diagnosis not present

## 2016-10-01 NOTE — Progress Notes (Signed)
Cardiology Office Note Date:  10/01/2016   ID:  Derrick Wood, DOB [birth year], MRN [id number]  PCP:  Frederich Chick, MD  Cardiologist:  Tonny Bollman, MD    Chief Complaint  Patient presents with  . Palpitations    follow up     History of Present Illness: Derrick Wood is a 61 y.o. male who presents for follow-up of PVC's and cardiomyopathy. He was initially seen in June 2016 after presenting at a hospital in [place] with palpitations and weakness. Echo and nuclear stress testing was performed and showed:  Mild LV dysfunction by echo with an LVEF of 45%  Bicuspid aortic valve with normal function  Mildly dilated aortic root Good exercise tolerance with diaphragmatic attenuation but otherwise normal perfusion on stress testing  The patient is here alone today. He is doing very well. He had a follow-up MRI with results outlined below. The findings somewhat contradicted earlier findings from his testing. 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 LV function is now normal. His aortic root dilatation is unchanged with a maximal aortic diameter of 4.0 cm.  From a symptomatic perspective he has no complaints today. Today, he denies symptoms of palpitations, chest pain, shortness of breath, orthopnea, PND, lower extremity edema, dizziness, or syncope. He continues to exercise regularly without exertional symptoms.   Past Medical History:  Diagnosis Date  . Palpitations   . PVC (premature ventricular contraction)     Past Surgical History:  Procedure Laterality Date  . ROTATER CUFF REPAIR     LEFT SHOULDER    Current Outpatient Prescriptions  Medication Sig Dispense Refill  . aspirin 81 MG tablet Take 81 mg by mouth daily.    . metoprolol succinate (TOPROL-XL) 25 MG 24 hr tablet TAKE 1 TABLET BY MOUTH ONCE DAILY AT BEDTIME 30 tablet 6  . omeprazole (PRILOSEC) 20 MG capsule Take 20 mg by mouth daily.    . ramipril (ALTACE) 2.5 MG capsule TAKE 1 CAPSULE(2.5 MG)  BY MOUTH DAILY 90 capsule 2  . ramipril (ALTACE) 5 MG capsule Take 1 capsule (5 mg total) by mouth daily. 90 capsule 3  . zolpidem (AMBIEN) 10 MG tablet Take 10 mg by mouth at bedtime as needed for sleep.     No current facility-administered medications for this visit.     Allergies:   Codeine   Social History:  The patient  reports that he has been smoking.  He started smoking about 11 years ago. He has quit using smokeless tobacco. He reports that he drinks alcohol. He reports that he does not use drugs.   Family History:  The patient's  family history includes Heart attack in his father; Heart failure in his father; Hypertension in his brother.    ROS:  Please see the history of present illness.  All other systems are reviewed and negative.    PHYSICAL EXAM: VS:  BP 118/78   Pulse 80   Ht 5\' 11"  (1.803 m)   Wt 208 lb 9.6 oz (94.6 kg)   BMI 29.09 kg/m  , BMI Body mass index is 29.09 kg/m. GEN: Well nourished, well developed, in no acute distress  HEENT: normal  Neck: no JVD, no masses. No carotid bruits Cardiac: RRR without murmur or gallop                Respiratory:  clear to auscultation bilaterally, normal work of breathing GI: soft, nontender, nondistended, + BS MS: no deformity or atrophy  Ext:  no pretibial edema, pedal pulses 2+= bilaterally Skin: warm and dry, no rash Neuro:  Strength and sensation are intact Psych: euthymic mood, full affect  EKG:  EKG is ordered today. The ekg ordered today shows normal sinus rhythm 80 bpm, within normal limits  Recent Labs: 08/14/2016: Creatinine, Ser 1.18   Lipid Panel     Component Value Date/Time   CHOL 213 (H) 02/24/2015 0809   TRIG 137.0 02/24/2015 0809   HDL 56.90 02/24/2015 0809   CHOLHDL 4 02/24/2015 0809   VLDL 27.4 02/24/2015 0809   LDLCALC 129 (H) 02/24/2015 0809      Wt Readings from Last 3 Encounters:  10/01/16 208 lb 9.6 oz (94.6 kg)  10/05/15 204 lb 9.6 oz (92.8 kg)  08/18/15 207 lb 8 oz (94.1 kg)       Cardiac Studies Reviewed: Cardiac MRI 08/14/2016: IMPRESSION: 1) Aortic valve appears trileaflet and better imaged in this scan compared to 07/13/2015 no AR/AS  2) Mild aortic root dilatation 4.0 cm no change from 2016  3) Normal LV size and function EF 69% apparently improved from 2016  4) No delayed hyperenhancement or infarct seen on gadolinium images of LV myocardium  5) No coarctation or other aortic pathology identified   ASSESSMENT AND PLAN: 1.  Aortic root dilatation: Stable findings by MRI study. Would repeat in approximately 2 years.  2. Nonischemic cardiomyopathy without symptoms of heart failure: Interesting that his LVEF has normalized by recent MRI study. He is tolerating a combination of metoprolol succinate and ramipril.  3. Heart palpitations/PVCs: The patient has undergone Holter monitoring in the past with a low PVC burden. He's on a beta blocker.  4. Hyperlipidemia: Last lipids from 2016 reviewed. Will repeat a lipid panel and LFTs today. Will check a cardiac CT for calcium score to help risk stratify and determine whether to consider a statin drug. His LDL cholesterol is above goal but he has a favorable HDL.  Current medicines are reviewed with the patient today.  The patient does not have concerns regarding medicines.  Labs/ tests ordered today include:  No orders of the defined types were placed in this encounter.   Disposition:   FU one year  Signed, Tonny Bollman, MD  10/01/2016 8:43 AM    [facility] [address] [employer], [place], [place]  [address] Phone: [phone]; Fax: [phone]

## 2016-10-01 NOTE — Patient Instructions (Signed)
Medication Instructions:  Your physician recommends that you continue on your current medications as directed. Please refer to the Current Medication list given to you today.  Labwork: Your physician recommends that you have lab work today: LIPID and CMP  Testing/Procedures: Your physician has recommended a Calcium Score ($150 out of pocket).  Your physician has requested that you have a cardiac MRI in 2 YEARS. Cardiac MRI uses a computer to create images of your heart as its beating, producing both still and moving pictures of your heart and major blood vessels. For further information please visit [email or web address]. Please follow the instruction sheet given to you today for more information.  Follow-Up: Your physician wants you to follow-up in: 1 YEAR with Dr Excell Seltzer.  You will receive a reminder letter in the mail two months in advance. If you don't receive a letter, please call our office to schedule the follow-up appointment.   Any Other Special Instructions Will Be Listed Below (If Applicable).     If you need a refill on your cardiac medications before your next appointment, please call your pharmacy.

## 2016-10-02 ENCOUNTER — Other Ambulatory Visit: Payer: Self-pay

## 2016-10-02 DIAGNOSIS — E785 Hyperlipidemia, unspecified: Secondary | ICD-10-CM

## 2016-10-02 LAB — LIPID PANEL
CHOL/HDL RATIO: 3.9 ratio (ref 0.0–5.0)
CHOLESTEROL TOTAL: 220 mg/dL — AB (ref 100–199)
HDL: 56 mg/dL (ref >39)
LDL Calculated: 140 mg/dL — ABNORMAL HIGH (ref 0–99)
TRIGLYCERIDES: 118 mg/dL (ref 0–149)
VLDL Cholesterol Cal: 24 mg/dL (ref 5–40)

## 2016-10-02 LAB — COMPREHENSIVE METABOLIC PANEL
A/G RATIO: 2 (ref 1.2–2.2)
ALT: 24 IU/L (ref 0–44)
AST: 25 IU/L (ref 0–40)
Albumin: 4.3 g/dL (ref 3.6–4.8)
Alkaline Phosphatase: 55 IU/L (ref 39–117)
BILIRUBIN TOTAL: 1.3 mg/dL — AB (ref 0.0–1.2)
BUN/Creatinine Ratio: 17 (ref 10–24)
BUN: 16 mg/dL (ref 8–27)
CHLORIDE: 102 mmol/L (ref 96–106)
CO2: 24 mmol/L (ref 18–29)
Calcium: 8.9 mg/dL (ref 8.6–10.2)
Creatinine, Ser: 0.96 mg/dL (ref 0.76–1.27)
GFR calc Af Amer: 98 mL/min/{1.73_m2} (ref >59)
GFR calc non Af Amer: 85 mL/min/{1.73_m2} (ref >59)
GLOBULIN, TOTAL: 2.1 g/dL (ref 1.5–4.5)
Glucose: 84 mg/dL (ref 65–99)
POTASSIUM: 4.6 mmol/L (ref 3.5–5.2)
SODIUM: 140 mmol/L (ref 134–144)
Total Protein: 6.4 g/dL (ref 6.0–8.5)

## 2017-04-01 ENCOUNTER — Other Ambulatory Visit: Payer: 59 | Admitting: *Deleted

## 2017-04-01 DIAGNOSIS — E785 Hyperlipidemia, unspecified: Secondary | ICD-10-CM

## 2017-04-01 LAB — COMPREHENSIVE METABOLIC PANEL
ALK PHOS: 56 IU/L (ref 39–117)
ALT: 18 IU/L (ref 0–44)
AST: 20 IU/L (ref 0–40)
Albumin/Globulin Ratio: 2 (ref 1.2–2.2)
Albumin: 3.9 g/dL (ref 3.6–4.8)
BILIRUBIN TOTAL: 0.5 mg/dL (ref 0.0–1.2)
BUN / CREAT RATIO: 16 (ref 10–24)
BUN: 17 mg/dL (ref 8–27)
CHLORIDE: 106 mmol/L (ref 96–106)
CO2: 22 mmol/L (ref 20–29)
CREATININE: 1.06 mg/dL (ref 0.76–1.27)
Calcium: 8.7 mg/dL (ref 8.6–10.2)
GFR calc Af Amer: 87 mL/min/{1.73_m2} (ref >59)
GFR calc non Af Amer: 75 mL/min/{1.73_m2} (ref >59)
GLUCOSE: 90 mg/dL (ref 65–99)
Globulin, Total: 2 g/dL (ref 1.5–4.5)
Potassium: 4.6 mmol/L (ref 3.5–5.2)
Sodium: 141 mmol/L (ref 134–144)
Total Protein: 5.9 g/dL — ABNORMAL LOW (ref 6.0–8.5)

## 2017-04-01 LAB — LIPID PANEL
CHOLESTEROL TOTAL: 187 mg/dL (ref 100–199)
Chol/HDL Ratio: 3.4 ratio (ref 0.0–5.0)
HDL: 55 mg/dL (ref >39)
LDL CALC: 108 mg/dL — AB (ref 0–99)
TRIGLYCERIDES: 121 mg/dL (ref 0–149)
VLDL CHOLESTEROL CAL: 24 mg/dL (ref 5–40)

## 2017-10-08 ENCOUNTER — Other Ambulatory Visit: Payer: Self-pay | Admitting: Cardiovascular Disease

## 2017-11-10 ENCOUNTER — Other Ambulatory Visit: Payer: Self-pay | Admitting: Cardiovascular Disease

## 2017-12-04 ENCOUNTER — Other Ambulatory Visit: Payer: Self-pay | Admitting: Cardiovascular Disease

## 2017-12-04 ENCOUNTER — Telehealth: Payer: Self-pay | Admitting: Cardiovascular Disease

## 2017-12-04 NOTE — Telephone Encounter (Signed)
New Message:      *STAT* If patient is at the pharmacy, call can be transferred to refill team.   1. Which medications need to be refilled? (please list name of each medication and dose if known) ramipril (ALTACE) 2.5 MG capsule Take 1 capsule (2.5 mg total) by mouth daily. Please make overdue appt with Dr. Excell Seltzer before anymore refills. 2nd attempt , metoprolol succinate (TOPROL-XL) 25 MG 24 hr tablet Take 1 tablet (25 mg total) by mouth at bedtime. Please make overdue appt with Dr. Excell Seltzer before anymore refills. 2nd attempt  2. Which pharmacy/location (including street and city if local pharmacy) is medication to be sent to?[id number] - [place], [place] - [address] AT [facility] OF [address]  3. Do they need a 30 day or 90 day supply? 30

## 2017-12-05 MED ORDER — METOPROLOL SUCCINATE ER 25 MG PO TB24: 25.0000 mg | ORAL_TABLET | Freq: Every day | ORAL | 3 refills | Status: DC

## 2017-12-05 MED ORDER — RAMIPRIL 2.5 MG PO CAPS: 2.5000 mg | ORAL_CAPSULE | Freq: Every day | ORAL | 3 refills | Status: DC

## 2017-12-05 NOTE — Telephone Encounter (Signed)
Approved    Disp Refills Start End  ramipril (ALTACE) 2.5 MG capsule 30 capsule 3 12/05/2017   Sig - Route:  Take 1 capsule (2.5 mg total) by mouth daily. Please keep upcoming appt before anymore refills. Thank you - Oral  Class:  Normal  DAW:  No  Comment:  Pt must keep upcoming appt before anymore refills. Thank you  Authorizing Provider:  Tonny Bollman, MD  Ordering User:  Demetrios Loll  metoprolol succinate (TOPROL-XL) 25 MG 24 hr tablet 30 tablet 3 12/05/2017   Sig - Route:  Take 1 tablet (25 mg total) by mouth at bedtime. Please keep upcoming appt before anymore refills. Thank you - Oral  Class:  Normal  DAW:  No  Comment:  Pt must keep upcoming appt before anymore refills. Thanks  Authorizing Provider:  Tonny Bollman, MD  Ordering User:  Demetrios Loll  Visit Pharmacy   [id number] - [place],  - [address] AT [facility] OF [address]

## 2017-12-05 NOTE — Telephone Encounter (Signed)
Pt's medication was sent to pt's pharmacy as requested. Confirmation received.  °

## 2018-03-10 ENCOUNTER — Ambulatory Visit: Payer: 59 | Admitting: Cardiovascular Disease

## 2018-03-10 ENCOUNTER — Encounter (INDEPENDENT_AMBULATORY_CARE_PROVIDER_SITE_OTHER): Payer: Self-pay

## 2018-03-10 ENCOUNTER — Encounter: Payer: Self-pay | Admitting: Cardiovascular Disease

## 2018-03-10 VITALS — BP 128/72 | HR 60 | Ht 71.0 in | Wt 198.0 lb

## 2018-03-10 DIAGNOSIS — I429 Cardiomyopathy, unspecified: Secondary | ICD-10-CM

## 2018-03-10 DIAGNOSIS — I77810 Thoracic aortic ectasia: Secondary | ICD-10-CM | POA: Diagnosis not present

## 2018-03-10 DIAGNOSIS — I428 Other cardiomyopathies: Secondary | ICD-10-CM | POA: Diagnosis not present

## 2018-03-10 NOTE — Patient Instructions (Signed)
Medication Instructions:  Your provider recommends that you continue on your current medications as directed. Please refer to the Current Medication list given to you today.    Labwork: Your provider recommends that you return for lab work in 1 YEAR, prior to your appointment with Dr. Excell Seltzer.  Testing/Procedures: Dr. Excell Seltzer recommends you have a CARDIAC MRI in 1 year.  Follow-Up: Your provider wants you to follow-up in: 1 year with Dr. Excell Seltzer. You will receive a reminder letter in the mail two months in advance. If you don't receive a letter, please call our office to schedule the follow-up appointment.    Any Other Special Instructions Will Be Listed Below (If Applicable).     If you need a refill on your cardiac medications before your next appointment, please call your pharmacy.

## 2018-03-10 NOTE — Progress Notes (Signed)
Cardiology Office Note Date:  03/10/2018   ID:  Derrick Wood, DOB 10/10/1955, MRN [id number]  PCP:  Shirlean Mylar, MD  Cardiologist:  Tonny Bollman, MD    Chief Complaint  Patient presents with  . Follow-up    cardiomyopathy     History of Present Illness: Derrick Wood is a 62 y.o. male who presents for  follow-up of PVC's and cardiomyopathy. He was initially seen in June 2016 after presenting at a hospital in [place] with palpitations and weakness. Echo and nuclear stress testing was performed and showed:  Mild LV dysfunction by echo with an LVEF of 45%  Bicuspid aortic valve with normal function  Mildly dilated aortic root Good exercise tolerance with diaphragmatic attenuation but otherwise normal perfusion on stress testing.  He returned last year for follow-up and an MRI was repeated.  This study contradicted previous scan and actually demonstrated by tricuspid aortic valve, low normal left ventricular function and mild dilatation of the a sending aorta at 4.0 cm.  The patient is here alone today.  He has been feeling well.  He is active exercising regularly.  He denies chest pain, shortness of breath or heart palpitations.  He has been compliant with his medications.  He did run out of metoprolol for a few days felt "funny" when he was not taking it.   Past Medical History:  Diagnosis Date  . Palpitations   . PVC (premature ventricular contraction)     Past Surgical History:  Procedure Laterality Date  . ROTATER CUFF REPAIR     LEFT SHOULDER    Current Outpatient Medications  Medication Sig Dispense Refill  . famotidine (PEPCID) 20 MG tablet Take 1 tablet by mouth as needed for heartburn.    . metoprolol succinate (TOPROL-XL) 25 MG 24 hr tablet Take 1 tablet (25 mg total) by mouth at bedtime. Please keep upcoming appt before anymore refills. Thank you 30 tablet 3  . ramipril (ALTACE) 2.5 MG capsule Take 1 capsule (2.5 mg total) by mouth daily. Please keep upcoming  appt before anymore refills. Thank you 30 capsule 3  . sildenafil (VIAGRA) 100 MG tablet Take 1 tablet by mouth as needed for erectile dysfunction.    Marland Kitchen zolpidem (AMBIEN) 10 MG tablet Take 10 mg by mouth at bedtime as needed for sleep.     No current facility-administered medications for this visit.     Allergies:   Codeine   Social History:  The patient  reports that he has been smoking.  He started smoking about 13 years ago. He has quit using smokeless tobacco. He reports that he drinks alcohol. He reports that he does not use drugs.   Family History:  The patient's family history includes Heart attack in his father; Heart failure in his father; Hypertension in his brother.    ROS:  Please see the history of present illness.  All other systems are reviewed and negative.    PHYSICAL EXAM: VS:  BP 128/72   Pulse 60   Ht 5\' 11"  (1.803 m)   Wt 198 lb (89.8 kg)   SpO2 96%   BMI 27.62 kg/m  , BMI Body mass index is 27.62 kg/m. GEN: Well nourished, well developed, in no acute distress  HEENT: normal  Neck: no JVD, no masses. No carotid bruits Cardiac: RRR without murmur or gallop                Respiratory:  clear to auscultation bilaterally, normal work of breathing GI:  soft, nontender, nondistended, + BS MS: no deformity or atrophy  Ext: no pretibial edema, pedal pulses 2+= bilaterally Skin: warm and dry, no rash Neuro:  Strength and sensation are intact Psych: euthymic mood, full affect  EKG:  EKG is ordered today. The ekg ordered today shows normal sinus rhythm 60 bpm, within normal limits.  Recent Labs: 04/01/2017: ALT 18; BUN 17; Creatinine, Ser 1.06; Potassium 4.6; Sodium 141   Lipid Panel     Component Value Date/Time   CHOL 187 04/01/2017 0743   TRIG 121 04/01/2017 0743   HDL 55 04/01/2017 0743   CHOLHDL 3.4 04/01/2017 0743   CHOLHDL 4 02/24/2015 0809   VLDL 27.4 02/24/2015 0809   LDLCALC 108 (H) 04/01/2017 0743      Wt Readings from Last 3 Encounters:    03/10/18 198 lb (89.8 kg)  10/01/16 208 lb 9.6 oz (94.6 kg)  10/05/15 204 lb 9.6 oz (92.8 kg)     Cardiac Studies Reviewed: Cardiac MRI 08/14/2016: IMPRESSION: 1) Aortic valve appears trileaflet and better imaged in this scan compared to 07/13/2015 no AR/AS  2) Mild aortic root dilatation 4.0 cm no change from 2016  3) Normal LV size and function EF 69% apparently improved from 2016  4) No delayed hyperenhancement or infarct seen on gadolinium images of LV myocardium  5) No coarctation or other aortic pathology identified  CT cardiac scoring: FINDINGS: Non-cardiac: No significant non cardiac findings on limited lung and soft tissue windows. See separate report from [facility].  Ascending Aorta:  4.1 cm  Pericardium: Normal  Coronary arteries: Calcification noted primarily in LAD/Diagonal branches with smaller amount in LM and one isolated area in mid RCA  IMPRESSION: Coronary calcium score of 123. This was 50th percentile for age and sex matched control.  Mild aortic root dilatation 4.1 cm  ASSESSMENT AND PLAN: 1.  Aortic root dilatation: Stable findings on MRI approximately 18 months ago.  Anticipate repeating his study next year. No evident aortic valve pathology.   2.  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  LVEF normalized on most recent MRI study.  Repeat imaging study next year.  3.  Palpitations/PVCs: Low PVC burden on Holter monitoring.  Treated with beta-blocker. Asymptomatic.   4.  Hyperlipidemia: Last lipids reviewed cholesterol 187, triglycerides 141 HDL 55, LDL 108.  Cholesterol/HDL ratio is 3.4.  We went through cardiovascular risk is approximately 8%, suggesting benefit for statin therapy.  We discussed pros and cons. He declines and prefers diet and lifestyle modification. Will update lipids next year at follow-up.  Current medicines are reviewed with the  patient today.  The patient does not have concerns regarding medicines.  Labs/ tests ordered today include:   Orders Placed This Encounter  Procedures  . MR CARDIAC MORPHOLOGY W WO CONTRAST  . Comprehensive metabolic panel  . Lipid panel  . EKG 12-Lead    Disposition:   FU one year with a cardiac MRI/MRA prior to his visit.   Signed, Tonny Bollman, MD  03/10/2018 5:42 PM    [facility] [address] [place], [place], [place]  [address] Phone: [phone]; Fax: [phone]

## 2018-03-18 ENCOUNTER — Other Ambulatory Visit: Payer: Self-pay | Admitting: Cardiovascular Disease

## 2018-03-24 ENCOUNTER — Other Ambulatory Visit: Payer: Self-pay | Admitting: Cardiovascular Disease

## 2019-02-09 ENCOUNTER — Other Ambulatory Visit: Payer: Self-pay | Admitting: Cardiovascular Disease

## 2019-03-18 ENCOUNTER — Ambulatory Visit (HOSPITAL_COMMUNITY): Payer: 59

## 2019-04-07 ENCOUNTER — Telehealth: Payer: Self-pay

## 2019-04-07 NOTE — Telephone Encounter (Signed)
Scheduled the patient August 26 for virtual visit with Dr. Burt Knack. He is working remotely from [place] and will not be in [place] at that time. Consent obtained. See below.      Virtual Visit Pre-Appointment Phone Call Confirm consent - "In the setting of the current Covid19 crisis, you are scheduled for a (phone or video) visit with your provider on (date) at (time).  Just as we do with many in-office visits, in order for you to participate in this visit, we must obtain consent.  If you'd like, I can send this to your mychart (if signed up) or email for you to review.  Otherwise, I can obtain your verbal consent now.  All virtual visits are billed to your insurance company just like a normal visit would be.  By agreeing to a virtual visit, we'd like you to understand that the technology does not allow for your provider to perform an examination, and thus may limit your provider's ability to fully assess your condition. If your provider identifies any concerns that need to be evaluated in person, we will make arrangements to do so.  Finally, though the technology is pretty good, we cannot assure that it will always work on either your or our end, and in the setting of a video visit, we may have to convert it to a phone-only visit.  In either situation, we cannot ensure that we have a secure connection.  Are you willing to proceed?" STAFF: Did the patient verbally acknowledge consent to telehealth visit? Document YES/NO here: YES    TELEPHONE CALL NOTE  Derrick Wood has been deemed a candidate for a follow-up tele-health visit to limit community exposure during the Covid-19 pandemic. I spoke with the patient via phone to ensure availability of phone/video source, confirm preferred email & phone number, and discuss instructions and expectations.  I reminded Derrick Wood to be prepared with any vital sign and/or heart rhythm information that could potentially be obtained via home monitoring, at the time of  his visit. I reminded Derrick Wood to expect a phone call prior to his visit.  Derrick Parma, RN 04/07/2019 3:08 PM    IF USING DOXIMITY or [email or web address] - The patient will receive a link just prior to their visit by text.

## 2019-04-27 ENCOUNTER — Telehealth (HOSPITAL_COMMUNITY): Payer: Self-pay | Admitting: Emergency Medicine

## 2019-04-27 ENCOUNTER — Encounter (HOSPITAL_COMMUNITY): Payer: Self-pay | Admitting: Emergency Medicine

## 2019-04-27 NOTE — Telephone Encounter (Signed)
Left message on voicemail with name and callback number Abrahim Sargent RN Navigator Cardiac Imaging [facility] [phone] Office [phone] Cell  

## 2019-04-28 ENCOUNTER — Other Ambulatory Visit: Payer: Self-pay

## 2019-04-28 ENCOUNTER — Ambulatory Visit (HOSPITAL_COMMUNITY)
Admission: RE | Admit: 2019-04-28 | Discharge: 2019-04-28 | Disposition: A | Discharge status: Home / Self Care | Payer: 59 | Source: Ambulatory Visit | Attending: Cardiovascular Disease | Admitting: Cardiovascular Disease

## 2019-04-28 DIAGNOSIS — I429 Cardiomyopathy, unspecified: Secondary | ICD-10-CM | POA: Diagnosis not present

## 2019-04-28 LAB — CREATININE, SERUM
Creatinine, Ser: 1.12 mg/dL (ref 0.61–1.24)
GFR calc Af Amer: >60 mL/min (ref >60)
GFR calc non Af Amer: >60 mL/min (ref >60)

## 2019-04-28 MED ORDER — GADOBUTROL 1 MMOL/ML IV SOLN
10.0000 mL | Freq: Once | INTRAVENOUS | Status: AC | PRN
  Administered 2019-04-28: 10 mL via INTRAVENOUS

## 2019-05-06 ENCOUNTER — Other Ambulatory Visit: Payer: Self-pay

## 2019-05-06 ENCOUNTER — Telehealth (INDEPENDENT_AMBULATORY_CARE_PROVIDER_SITE_OTHER): Payer: 59 | Admitting: Cardiovascular Disease

## 2019-05-06 ENCOUNTER — Encounter: Payer: Self-pay | Admitting: Cardiovascular Disease

## 2019-05-06 VITALS — HR 76 | Ht 70.0 in | Wt 200.0 lb

## 2019-05-06 DIAGNOSIS — I428 Other cardiomyopathies: Secondary | ICD-10-CM | POA: Diagnosis not present

## 2019-05-06 DIAGNOSIS — I77810 Thoracic aortic ectasia: Secondary | ICD-10-CM | POA: Diagnosis not present

## 2019-05-06 NOTE — Progress Notes (Signed)
Virtual Visit via Video Note   This visit type was conducted due to national recommendations for restrictions regarding the COVID-19 Pandemic (e.g. social distancing) in an effort to limit this patient's exposure and mitigate transmission in our community.  Due to his co-morbid illnesses, this patient is at least at moderate risk for complications without adequate follow up.  This format is felt to be most appropriate for this patient at this time.  All issues noted in this document were discussed and addressed.  A limited physical exam was performed with this format.  Please refer to the patient's chart for his consent to telehealth for [facility].   Date:  05/06/2019   ID:  Derrick Wood, DOB 11-27-1955, MRN [id number]  Patient Location: Home Provider Location: Office  PCP:  Maurice Small, MD  Cardiologist:  No primary care provider on file.  Electrophysiologist:  None   Evaluation Performed:  Follow-Up Visit  Chief Complaint:  Follow-up cardiomyopathy  History of Present Illness:    Derrick Wood is a 63 y.o. male with history of mild cardiomyopathy, LVEF 45%, presenting for follow-up evaluation via telehealth visit in light of the current Covid-19 pandemic.   The patient does not have symptoms concerning for COVID-19 infection (fever, chills, cough, or new shortness of breath).   He is doing very well. He's been eating a healthy diet and exercising regularly. He denies any exertional symptoms.  Today, he denies symptoms of palpitations, chest pain, shortness of breath, orthopnea, PND, lower extremity edema, dizziness, or syncope.  Past Medical History:  Diagnosis Date  . Palpitations   . PVC (premature ventricular contraction)    Past Surgical History:  Procedure Laterality Date  . ROTATER CUFF REPAIR     LEFT SHOULDER     Current Meds  Medication Sig  . famotidine (PEPCID) 20 MG tablet Take 1 tablet by mouth as needed for heartburn.  . metoprolol succinate  (TOPROL-XL) 25 MG 24 hr tablet TAKE 1 TABLET BY MOUTH AT BEDTIME  . ramipril (ALTACE) 2.5 MG capsule TAKE ONE CAPSULE BY MOUTH DAILY  . sildenafil (VIAGRA) 100 MG tablet Take 1 tablet by mouth as needed for erectile dysfunction.  Marland Kitchen zolpidem (AMBIEN) 10 MG tablet Take 10 mg by mouth at bedtime as needed for sleep.     Allergies:   Codeine   Social History   Tobacco Use  . Smoking status: Light Tobacco Smoker    Start date: 02/08/2005  . Smokeless tobacco: Former Network engineer Use Topics  . Alcohol use: Yes  . Drug use: No     Family Hx: The patient's family history includes Heart attack in his father; Heart failure in his father; Hypertension in his brother.  ROS:   Please see the history of present illness.    All other systems reviewed and are negative.   Prior CV studies:   The following studies were reviewed today:  Cardiac MRI 04-28-2019: FINDINGS: 1. Normal left ventricular size, with mild basal septal hypertrophy and normal systolic function (LVEF = 59%). There are no regional wall motion abnormalities and no late gadolinium enhancement in the left ventricular myocardium.  LVEDD: 43 mm  LVESD: 30 mm  LVEDV: 92 ml  LVESV: 37 ml  SV: 55 ml  CO: 3.5 L/min  Myocardial mass: 103 g  2. Normal right ventricular size, thickness and systolic function (LVEF = 52%). There are no regional wall motion abnormalities.  3.  Normal left and right atrial size.  4. There is  mild dilatation of the aortic root at the sinus level (maximum diameter 41 mm) and at the level of the ascending aorta with maximum diameter 41 mm. Sinotubular junction, distal portion of the ascending aorta and aortic arch have normal size.  5. Trivial mitral and mild tricuspid regurgitation. Aortic valve is tricuspid.  6.  Normal pericardium.  No pericardial effusion.  IMPRESSION: 1. Normal left ventricular size, with mild basal septal hypertrophy and normal systolic function  (LVEF = 59%). There are no regional wall motion abnormalities and no late gadolinium enhancement in the left ventricular myocardium.  2. Normal right ventricular size, thickness and systolic function (LVEF = 52%). There are no regional wall motion abnormalities.  3. There is mild dilatation of the aortic root at the sinus level (maximum diameter 41 mm) and at the level of the ascending aorta with maximum diameter 41 mm. Sinotubular junction, distal portion of the ascending aorta and aortic arch have normal size. This is unchanged from the prior CT from 10/01/2016.  4. Trivial mitral and mild tricuspid regurgitation. Aortic valve is tricuspid.  Labs/Other Tests and Data Reviewed:    EKG:  No ECG reviewed.  Recent Labs: 04/28/2019: Creatinine, Ser 1.12   Recent Lipid Panel Lab Results  Component Value Date/Time   CHOL 187 04/01/2017 07:43 AM   TRIG 121 04/01/2017 07:43 AM   HDL 55 04/01/2017 07:43 AM   CHOLHDL 3.4 04/01/2017 07:43 AM   CHOLHDL 4 02/24/2015 08:09 AM   LDLCALC 108 (H) 04/01/2017 07:43 AM    Wt Readings from Last 3 Encounters:  05/06/19 200 lb (90.7 kg)  03/10/18 198 lb (89.8 kg)  10/01/16 208 lb 9.6 oz (94.6 kg)     Objective:    Vital Signs:  Pulse 76   Ht 5\' 10"  (1.778 m)   Wt 200 lb (90.7 kg)   BMI 28.70 kg/m    VITAL SIGNS:  reviewed  ASSESSMENT & PLAN:    1. Cardiomyopathy without symptoms of heart failure. His cardiac MRI is very reassuring, demonstrating normalization of LV function and no valvular disease. He will continue on current Rx which he tolerates very well. 2. Dilated aortic root: mild aortic dilatation noted max dimension 4.1 cm unchanged from 2018. Continue ARB/beta-blocker.  Overall doing very well. When he is back in [place], he will call to arrange lab draw for lipids/LFT's.   COVID-19 Education: The signs and symptoms of COVID-19 were discussed with the patient and how to seek care for testing (follow up with PCP or  arrange E-visit).  The importance of social distancing was discussed today.  Time:   Today, I have spent 15 minutes with the patient with telehealth technology discussing the above problems.     Medication Adjustments/Labs and Tests Ordered: Current medicines are reviewed at length with the patient today.  Concerns regarding medicines are outlined above.   Tests Ordered: Orders Placed This Encounter  Procedures  . Hepatic function panel  . Lipid panel    Medication Changes: No orders of the defined types were placed in this encounter.   Follow Up:  Virtual Visit or In Person in 1 year(s)  Signed, Tonny Bollman, MD  05/06/2019 5:27 PM    [facility]

## 2019-06-22 ENCOUNTER — Other Ambulatory Visit: Payer: Self-pay | Admitting: Cardiovascular Disease

## 2019-06-22 MED ORDER — RAMIPRIL 2.5 MG PO CAPS: 2.5000 mg | ORAL_CAPSULE | Freq: Every day | ORAL | 9 refills | Status: DC

## 2019-06-22 NOTE — Telephone Encounter (Signed)
Pt's medication was sent to pt's pharmacy as requested. Confirmation received.  °

## 2019-12-19 ENCOUNTER — Other Ambulatory Visit: Payer: Self-pay | Admitting: Cardiovascular Disease

## 2020-02-22 ENCOUNTER — Other Ambulatory Visit: Payer: Self-pay | Admitting: Cardiovascular Disease

## 2020-06-10 ENCOUNTER — Other Ambulatory Visit: Payer: Self-pay | Admitting: Cardiovascular Disease

## 2020-07-04 ENCOUNTER — Other Ambulatory Visit: Payer: Self-pay

## 2020-07-04 ENCOUNTER — Encounter: Payer: Self-pay | Admitting: Cardiovascular Disease

## 2020-07-04 ENCOUNTER — Ambulatory Visit: Payer: 59 | Admitting: Cardiovascular Disease

## 2020-07-04 VITALS — BP 108/74 | HR 62 | Ht 71.0 in | Wt 211.6 lb

## 2020-07-04 DIAGNOSIS — I77810 Thoracic aortic ectasia: Secondary | ICD-10-CM

## 2020-07-04 DIAGNOSIS — I428 Other cardiomyopathies: Secondary | ICD-10-CM | POA: Diagnosis not present

## 2020-07-04 LAB — COMPREHENSIVE METABOLIC PANEL
ALT: 39 IU/L (ref 0–44)
AST: 112 IU/L — ABNORMAL HIGH (ref 0–40)
Albumin/Globulin Ratio: 1.7 (ref 1.2–2.2)
Albumin: 4.5 g/dL (ref 3.8–4.8)
Alkaline Phosphatase: 80 IU/L (ref 44–121)
BUN/Creatinine Ratio: 18 (ref 10–24)
BUN: 21 mg/dL (ref 8–27)
Bilirubin Total: 0.9 mg/dL (ref 0.0–1.2)
CO2: 23 mmol/L (ref 20–29)
Calcium: 9.4 mg/dL (ref 8.6–10.2)
Chloride: 101 mmol/L (ref 96–106)
Creatinine, Ser: 1.15 mg/dL (ref 0.76–1.27)
GFR calc Af Amer: 77 mL/min/{1.73_m2} (ref >59)
GFR calc non Af Amer: 67 mL/min/{1.73_m2} (ref >59)
Globulin, Total: 2.7 g/dL (ref 1.5–4.5)
Glucose: 85 mg/dL (ref 65–99)
Potassium: 5.4 mmol/L — ABNORMAL HIGH (ref 3.5–5.2)
Sodium: 137 mmol/L (ref 134–144)
Total Protein: 7.2 g/dL (ref 6.0–8.5)

## 2020-07-04 LAB — CBC WITH DIFFERENTIAL/PLATELET
Basophils Absolute: 0.1 10*3/uL (ref 0.0–0.2)
Basos: 1 %
EOS (ABSOLUTE): 0.1 10*3/uL (ref 0.0–0.4)
Eos: 1 %
Hematocrit: 44.5 % (ref 37.5–51.0)
Hemoglobin: 15.5 g/dL (ref 13.0–17.7)
Immature Grans (Abs): 0 10*3/uL (ref 0.0–0.1)
Immature Granulocytes: 0 %
Lymphocytes Absolute: 2 10*3/uL (ref 0.7–3.1)
Lymphs: 45 %
MCH: 32.8 pg (ref 26.6–33.0)
MCHC: 34.8 g/dL (ref 31.5–35.7)
MCV: 94 fL (ref 79–97)
Monocytes Absolute: 0.5 10*3/uL (ref 0.1–0.9)
Monocytes: 11 %
Neutrophils Absolute: 1.9 10*3/uL (ref 1.4–7.0)
Neutrophils: 42 %
Platelets: 197 10*3/uL (ref 150–450)
RBC: 4.72 x10E6/uL (ref 4.14–5.80)
RDW: 12 % (ref 11.6–15.4)
WBC: 4.5 10*3/uL (ref 3.4–10.8)

## 2020-07-04 NOTE — Patient Instructions (Signed)
Medication Instructions:  Your provider recommends that you continue on your current medications as directed. Please refer to the Current Medication list given to you today.   *If you need a refill on your cardiac medications before your next appointment, please call your pharmacy*  Lab Work: TODAY! CMET, CBC If you have labs (blood work) drawn today and your tests are completely normal, you will receive your results only by: Marland Kitchen MyChart Message (if you have MyChart) OR . A paper copy in the mail If you have any lab test that is abnormal or we need to change your treatment, we will call you to review the results.  Follow-Up: At [facility], you and your health needs are our priority.  As part of our continuing mission to provide you with exceptional heart care, we have created designated Provider Care Teams.  These Care Teams include your primary Cardiologist (physician) and Advanced Practice Providers (APPs -  Physician Assistants and Nurse Practitioners) who all work together to provide you with the care you need, when you need it. Your next appointment:   12 month(s) The format for your next appointment:   In Person Provider:   You may see Tonny Bollman, MD or one of the following Advanced Practice Providers on your designated Care Team:    Tereso Newcomer, PA-C  [place], [place]

## 2020-07-04 NOTE — Progress Notes (Signed)
Cardiology Office Note:    Date:  07/04/2020   ID:  Derrick Wood, DOB [birth year], MRN [id number]  PCP:  Shirlean Mylar, MD  [facility] Cardiologist:  Tonny Bollman, MD  [facility] Electrophysiologist:  None   Referring MD: Shirlean Mylar, MD   Chief Complaint  Patient presents with  . Cardiomyopathy    History of Present Illness:    Derrick Wood is a 64 y.o. male with a hx of nonischemic cardiomyopathy, presenting for follow-up evaluation.  He has had mild cardiomyopathy with LVEF 45%.  He really has never had any cardiopulmonary symptoms.  The patient is here alone today for follow-up evaluation.  He has been treated with combination of metoprolol succinate and ramipril over time.  Last assessment of LV function was 1 year ago when he had a cardiac MRI study done. This demonstrated an LVEF of 59% without any regional wall motion abnormalities.  RV size and function was also normal.  The patient is exercising regularly with a trainer and has no exertional symptoms.  He specifically denies chest pain, chest pressure, or shortness of breath.  No edema, heart palpitations, orthopnea, PND, lightheadedness, or syncope.  He is spending most of his time in [place] these days.  He lives in [place].  His children live in [place].  Past Medical History:  Diagnosis Date  . Palpitations   . PVC (premature ventricular contraction)     Past Surgical History:  Procedure Laterality Date  . ROTATER CUFF REPAIR     LEFT SHOULDER    Current Medications: Current Meds  Medication Sig  . famotidine (PEPCID) 20 MG tablet Take 1 tablet by mouth as needed for heartburn.  . metoprolol succinate (TOPROL-XL) 25 MG 24 hr tablet TAKE 1 TABLET BY MOUTH AT BEDTIME  . ramipril (ALTACE) 2.5 MG capsule TAKE 1 CAPSULE(2.5 MG) BY MOUTH DAILY  . sildenafil (VIAGRA) 100 MG tablet Take 1 tablet by mouth as needed for erectile dysfunction.     Allergies:   Codeine   Social History   Socioeconomic  History  . Marital status: Married    Spouse name: Not on file  . Number of children: Not on file  . Years of education: Not on file  . Highest education level: Not on file  Occupational History  . Not on file  Tobacco Use  . Smoking status: Light Tobacco Smoker    Start date: 02/08/2005  . Smokeless tobacco: Former Engineer, water and Sexual Activity  . Alcohol use: Yes  . Drug use: No  . Sexual activity: Not on file  Other Topics Concern  . Not on file  Social History Narrative  . Not on file   Social Determinants of Health   Financial Resource Strain:   . Difficulty of Paying Living Expenses: Not on file  Food Insecurity:   . Worried About Programme researcher, broadcasting/film/video in the Last Year: Not on file  . Ran Out of Food in the Last Year: Not on file  Transportation Needs:   . Lack of Transportation (Medical): Not on file  . Lack of Transportation (Non-Medical): Not on file  Physical Activity:   . Days of Exercise per Week: Not on file  . Minutes of Exercise per Session: Not on file  Stress:   . Feeling of Stress : Not on file  Social Connections:   . Frequency of Communication with Friends and Family: Not on file  . Frequency of Social Gatherings with Friends and Family: Not  on file  . Attends Religious Services: Not on file  . Active Member of Clubs or Organizations: Not on file  . Attends Banker Meetings: Not on file  . Marital Status: Not on file     Family History: The patient's family history includes Heart attack in his father; Heart failure in his father; Hypertension in his brother.  ROS:   Please see the history of present illness.    All other systems reviewed and are negative.  EKGs/Labs/Other Studies Reviewed:    The following studies were reviewed today: CT Cardiac Scoring 10-01-2016: IMPRESSION: Coronary calcium score of 123. This was 50th percentile for age and sex matched control.  Mild aortic root dilatation 4.1 cm  Cardiac MR  04-28-2019: IMPRESSION: 1. Normal left ventricular size, with mild basal septal hypertrophy and normal systolic function (LVEF = 59%). There are no regional wall motion abnormalities and no late gadolinium enhancement in the left ventricular myocardium.  2. Normal right ventricular size, thickness and systolic function (LVEF = 52%). There are no regional wall motion abnormalities.  3. There is mild dilatation of the aortic root at the sinus level (maximum diameter 41 mm) and at the level of the ascending aorta with maximum diameter 41 mm. Sinotubular junction, distal portion of the ascending aorta and aortic arch have normal size. This is unchanged from the prior CT from 10/01/2016.  4. Trivial mitral and mild tricuspid regurgitation. Aortic valve is Tricuspid.  EKG:  EKG is ordered today.  The ekg ordered today demonstrates NSR 59 bpm, within normal limits  Recent Labs: No results found for requested labs within last 8760 hours.  Recent Lipid Panel    Component Value Date/Time   CHOL 187 04/01/2017 0743   TRIG 121 04/01/2017 0743   HDL 55 04/01/2017 0743   CHOLHDL 3.4 04/01/2017 0743   CHOLHDL 4 02/24/2015 0809   VLDL 27.4 02/24/2015 0809   LDLCALC 108 (H) 04/01/2017 0743     Risk Assessment/Calculations:       Physical Exam:    VS:  BP 108/74   Pulse 62   Ht 5\' 11"  (1.803 m)   Wt 211 lb 9.6 oz (96 kg)   SpO2 97%   BMI 29.51 kg/m     Wt Readings from Last 3 Encounters:  07/04/20 211 lb 9.6 oz (96 kg)  05/06/19 200 lb (90.7 kg)  03/10/18 198 lb (89.8 kg)     GEN:  Well nourished, well developed in no acute distress HEENT: Normal NECK: No JVD; No carotid bruits LYMPHATICS: No lymphadenopathy CARDIAC: RRR, no murmurs, rubs, gallops RESPIRATORY:  Clear to auscultation without rales, wheezing or rhonchi  ABDOMEN: Soft, non-tender, non-distended MUSCULOSKELETAL:  No edema; No deformity  SKIN: Warm and dry NEUROLOGIC:  Alert and oriented x 3 PSYCHIATRIC:   Normal affect   ASSESSMENT:    1. Nonischemic cardiomyopathy (HCC)   2. Dilated aortic root (HCC)    PLAN:    In order of problems listed above:  1. LV and RV function were normal on most recent assessment by cardiac MRI.  The patient has no symptoms at this time.  He will continue on metoprolol succinate and ramipril, both of which he tolerates well.  I will check a metabolic panel today as well as a CBC since he does not have a primary care physician. 2. Mildly dilated with last imaging assessment demonstrating a 4.1 cm aortic root.  This was stable over a 3 year period. Will reassess in  2-3 years.    Medication Adjustments/Labs and Tests Ordered: Current medicines are reviewed at length with the patient today.  Concerns regarding medicines are outlined above.  Orders Placed This Encounter  Procedures  . CBC with Differential/Platelet  . Comprehensive metabolic panel  . EKG 12-Lead   No orders of the defined types were placed in this encounter.   Patient Instructions  Medication Instructions:  Your provider recommends that you continue on your current medications as directed. Please refer to the Current Medication list given to you today.   *If you need a refill on your cardiac medications before your next appointment, please call your pharmacy*  Lab Work: TODAY! CMET, CBC If you have labs (blood work) drawn today and your tests are completely normal, you will receive your results only by: Marland Kitchen MyChart Message (if you have MyChart) OR . A paper copy in the mail If you have any lab test that is abnormal or we need to change your treatment, we will call you to review the results.  Follow-Up: At [facility], you and your health needs are our priority.  As part of our continuing mission to provide you with exceptional heart care, we have created designated Provider Care Teams.  These Care Teams include your primary Cardiologist (physician) and Advanced Practice Providers (APPs -   Physician Assistants and Nurse Practitioners) who all work together to provide you with the care you need, when you need it. Your next appointment:   12 month(s) The format for your next appointment:   In Person Provider:   You may see Tonny Bollman, MD or one of the following Advanced Practice Providers on your designated Care Team:    Tereso Newcomer, PA-C  Chelsea Aus, [place]      Signed, Tonny Bollman, MD  07/04/2020 1:18 PM     [facility]

## 2020-10-13 ENCOUNTER — Other Ambulatory Visit: Payer: Self-pay

## 2020-10-13 DIAGNOSIS — R748 Abnormal levels of other serum enzymes: Secondary | ICD-10-CM

## 2020-10-13 NOTE — Progress Notes (Signed)
Ordered per lab result for scheduling when patient calls back. Letter was sent to patient.

## 2020-11-14 ENCOUNTER — Other Ambulatory Visit: Payer: Self-pay | Admitting: Cardiovascular Disease

## 2020-12-05 ENCOUNTER — Other Ambulatory Visit: Payer: Self-pay | Admitting: Cardiovascular Disease

## 2021-04-24 ENCOUNTER — Other Ambulatory Visit: Payer: Self-pay | Admitting: Cardiovascular Disease

## 2022-05-04 ENCOUNTER — Other Ambulatory Visit: Payer: Self-pay | Admitting: Cardiovascular Disease
